# Patient Record
Sex: Female | Born: 1975 | Hispanic: No | Marital: Married | Smoking: Never smoker
Health system: Southern US, Community
[De-identification: ages and names within clinical notes are randomized; demographics above are authoritative.]

## PROBLEM LIST (undated history)

## (undated) DIAGNOSIS — N649 Disorder of breast, unspecified: Secondary | ICD-10-CM

## (undated) DIAGNOSIS — R87629 Unspecified abnormal cytological findings in specimens from vagina: Secondary | ICD-10-CM

## (undated) HISTORY — DX: Unspecified abnormal cytological findings in specimens from vagina: R87.629

## (undated) HISTORY — PX: COLPOSCOPY: SHX161

## (undated) HISTORY — DX: Disorder of breast, unspecified: N64.9

---

## 1998-05-15 ENCOUNTER — Other Ambulatory Visit: Admission: RE | Admit: 1998-05-15 | Discharge: 1998-05-15 | Payer: Self-pay | Admitting: Obstetrics and Gynecology

## 1999-11-08 ENCOUNTER — Emergency Department (HOSPITAL_COMMUNITY): Admission: EM | Admit: 1999-11-08 | Discharge: 1999-11-08 | Payer: Self-pay | Admitting: *Deleted

## 2000-03-09 ENCOUNTER — Inpatient Hospital Stay (HOSPITAL_COMMUNITY): Admission: AD | Admit: 2000-03-09 | Discharge: 2000-03-09 | Payer: Self-pay | Admitting: *Deleted

## 2000-03-14 ENCOUNTER — Inpatient Hospital Stay (HOSPITAL_COMMUNITY): Admission: AD | Admit: 2000-03-14 | Discharge: 2000-03-14 | Payer: Self-pay | Admitting: *Deleted

## 2000-03-21 ENCOUNTER — Inpatient Hospital Stay (HOSPITAL_COMMUNITY): Admission: AD | Admit: 2000-03-21 | Discharge: 2000-03-21 | Payer: Self-pay | Admitting: *Deleted

## 2000-03-28 ENCOUNTER — Inpatient Hospital Stay (HOSPITAL_COMMUNITY): Admission: AD | Admit: 2000-03-28 | Discharge: 2000-03-28 | Payer: Self-pay | Admitting: Obstetrics

## 2000-04-13 ENCOUNTER — Encounter: Payer: Self-pay | Admitting: *Deleted

## 2000-04-13 ENCOUNTER — Inpatient Hospital Stay (HOSPITAL_COMMUNITY): Admission: RE | Admit: 2000-04-13 | Discharge: 2000-04-13 | Payer: Self-pay | Admitting: *Deleted

## 2000-04-15 ENCOUNTER — Ambulatory Visit (HOSPITAL_COMMUNITY): Admission: RE | Admit: 2000-04-15 | Discharge: 2000-04-15 | Payer: Self-pay | Admitting: Obstetrics & Gynecology

## 2000-05-18 ENCOUNTER — Encounter: Admission: RE | Admit: 2000-05-18 | Discharge: 2000-05-18 | Payer: Self-pay | Admitting: Obstetrics & Gynecology

## 2000-08-22 ENCOUNTER — Inpatient Hospital Stay (HOSPITAL_COMMUNITY): Admission: AD | Admit: 2000-08-22 | Discharge: 2000-08-22 | Payer: Self-pay | Admitting: *Deleted

## 2000-08-24 ENCOUNTER — Inpatient Hospital Stay (HOSPITAL_COMMUNITY): Admission: AD | Admit: 2000-08-24 | Discharge: 2000-08-24 | Payer: Self-pay | Admitting: *Deleted

## 2000-08-31 ENCOUNTER — Inpatient Hospital Stay (HOSPITAL_COMMUNITY): Admission: AD | Admit: 2000-08-31 | Discharge: 2000-08-31 | Payer: Self-pay | Admitting: *Deleted

## 2000-09-08 ENCOUNTER — Inpatient Hospital Stay (HOSPITAL_COMMUNITY): Admission: AD | Admit: 2000-09-08 | Discharge: 2000-09-08 | Payer: Self-pay | Admitting: Obstetrics

## 2000-09-22 ENCOUNTER — Inpatient Hospital Stay (HOSPITAL_COMMUNITY): Admission: AD | Admit: 2000-09-22 | Discharge: 2000-09-22 | Payer: Self-pay | Admitting: Obstetrics

## 2000-10-03 ENCOUNTER — Inpatient Hospital Stay (HOSPITAL_COMMUNITY): Admission: AD | Admit: 2000-10-03 | Discharge: 2000-10-03 | Payer: Self-pay | Admitting: Obstetrics

## 2001-08-10 ENCOUNTER — Encounter: Admission: RE | Admit: 2001-08-10 | Discharge: 2001-11-08 | Payer: Self-pay | Admitting: Gynecology

## 2002-02-20 ENCOUNTER — Inpatient Hospital Stay (HOSPITAL_COMMUNITY): Admission: AD | Admit: 2002-02-20 | Discharge: 2002-02-23 | Payer: Self-pay | Admitting: *Deleted

## 2003-05-09 ENCOUNTER — Encounter (INDEPENDENT_AMBULATORY_CARE_PROVIDER_SITE_OTHER): Payer: Self-pay | Admitting: Specialist

## 2003-05-09 ENCOUNTER — Ambulatory Visit (HOSPITAL_BASED_OUTPATIENT_CLINIC_OR_DEPARTMENT_OTHER): Admission: RE | Admit: 2003-05-09 | Discharge: 2003-05-09 | Payer: Self-pay | Admitting: Gynecology

## 2003-07-04 ENCOUNTER — Other Ambulatory Visit: Admission: RE | Admit: 2003-07-04 | Discharge: 2003-07-04 | Payer: Self-pay | Admitting: Gynecology

## 2004-07-04 ENCOUNTER — Other Ambulatory Visit: Admission: RE | Admit: 2004-07-04 | Discharge: 2004-07-04 | Payer: Self-pay | Admitting: Gynecology

## 2005-07-16 ENCOUNTER — Other Ambulatory Visit: Admission: RE | Admit: 2005-07-16 | Discharge: 2005-07-16 | Payer: Self-pay | Admitting: Gynecology

## 2006-08-20 ENCOUNTER — Other Ambulatory Visit: Admission: RE | Admit: 2006-08-20 | Discharge: 2006-08-20 | Payer: Self-pay | Admitting: Gynecology

## 2007-08-26 ENCOUNTER — Other Ambulatory Visit: Admission: RE | Admit: 2007-08-26 | Discharge: 2007-08-26 | Payer: Self-pay | Admitting: Gynecology

## 2011-01-23 NOTE — H&P (Signed)
NAME:  Brianna Maldonado, Brianna Maldonado                        ACCOUNT NO.:  [id number]   MEDICAL RECORD NO.:  [id number]                   PATIENT TYPE:  AMB   LOCATION:  [facility]                                 FACILITY:  [facility]   PHYSICIAN:  Juan H. Lily Peer, M.D.             DATE OF BIRTH:  03-06-1976   DATE OF ADMISSION:  05/09/2003  DATE OF DISCHARGE:                                HISTORY & PHYSICAL   CHIEF COMPLAINT:  Left labial mass.   HISTORY:  The patient is a 35 year old gravida 2, para 1, AB 1 who presented  to the office on May 07, 2003 complaining of swelling in the left labial  region.  The patient has stated that she has had a Bartholin gland abscess  drained in 2001 and 2002 and she had been placed on antibiotics and sitz  bath and had had Word catheter and now this has recurred again.  On  inspection in the office she has got like a 5 cm left blocked Bartholin duct  which was tender on palpation; no other abnormality was noted.  The patient  is being scheduled to undergo I&D and marsupialization of Bartholin gland.   PAST MEDICAL HISTORY:  The patient has had one normal spontaneous vaginal  delivery.  She has had one miscarriage.  She has been using barrier  contraception and at one time had been on Depo-Provera.  She denies any  allergies.   PHYSICAL EXAMINATION:  GENERAL:  Well-developed, well-nourished female with  the above-mentioned complaint.  HEENT:  Unremarkable.  NECK:  Supple, trachea midline; no carotid bruits, no thyromegaly.  LUNGS:  Clear to auscultation without rhonchi or wheezes.  HEART:  Regular rate and rhythm without murmurs or gallops.  BREAST:  Exam not done.  ABDOMEN:  Soft, nontender without rebound or guarding.  PELVIC:  Bartholin, urethral, Skene glands as described above.  Vagina and  cervix no lesion or discharge.  Uterus anteverted, normal size, shape and  consistency.  Adnexa no mass or tenderness.  RECTAL:  Exam deferred.   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  Risks, benefits  and pros and cons of the  operation were discussed with the patient to include infection, bleeding,  trauma to internal organs or nearby structures.  She will be placed on  prophylaxis antibiotics.  She is currently on dicloxacillin 500 mg q.i.d.  which was prescribed to her on May 07, 2003 at the office which she will  continue to complete a 10-day course, all of the above was explained in  [place], all questions were answered and we will follow accordingly.   PLAN:  The patient scheduled for Bartholin gland marsupialization Wednesday,  May 09, 2003 at 8:30 a.m. at the [facility]  [facility].  Please  have history and physical available.     JHF/MEDQ  D:  05/08/2003  T:  05/08/2003  Job:  [id number]

## 2011-01-23 NOTE — Consult Note (Signed)
[place]  Patient:    Brianna Maldonado, Brianna Maldonado                     MRN: [id number] Proc. Date: 11/08/99 Adm. Date:  [id number] Attending:  Carmelina Peal CC:         Earl Lites, M.D., [facility]                          Consultation Report  CHIEF COMPLAINT:  Right peritonsillar abscess.  HISTORY OF PRESENT ILLNESS:  A 35 year old [place] immigrant with a four-day history of progressive sore throat localizing to the right side with ear pain, trismus and difficulty swallowing.  She speaks very little [place] but is accompanied by her sister who helps [place] with the history.  Earlier today, she saw Dr. Cleta Alberts at [facility] who sent her our way with the diagnosis of a peritonsillar abscess.  She received a jar of amoxicillin capsules but has not yet had any therapy.  She denies fever.  No history of diabetes or immune compromise.  No past history of Strep throat, tonsillitis, or abscess.  PHYSICAL EXAMINATION:  GENERAL:  This is an adult, Hispanic female looking anxious and in discomfort. She is not warm to touch.  She has a significant fetor oris and also some trismus. She has a hot-potato voice.  HEENT:  Head is atraumatic.  Ear canals clear with aerated drums of normal configuration.  Cranial nerves intact.  Anterior nose clear.  Oral cavity is moist with teeth in good repair.  She has erythematous bulging of the right hem-soft palate, exudate and debris from the tonsil itself, deviation of the uvula across the midline towards the left, and an essentially normal-appearing left tonsil.  NECK:  Slightly tender over the jugular epigastric region without discrete adenopathy.  IMPRESSION:  Right peritonsillar abscess.  PLAN:  With informed consent, I anesthetized the pharynx with Hurricaine spray,  localized the peritonsillar tissues with 1% Xylocaine with 1:100,000 epinephrine, 8 cc total, and finally performed a 2 cm crescent incision over the  superior pole of the tonsil, carried down to the capsule of the tonsil.  Behind the superior pole, and large abscess cavity was encountered sharply and bluntly.  The cavity was fully evacuated.  Hemostasis was spontaneous.  The patient tolerated the procedure nicely.  Will have her gargle with saline and peroxide to clear the oral cavity. We will send her home with liquid amoxicillin, 1 g x 2 doses, 500 mg for 3 more  days, to be followed by 500 mg 2 times daily of capsules for an additional 7 days. Will send her home with Tylenol with codeine elixir, enough to last her 3 or 4 days.  We will see her back in the office in two days to reevaluate.  In the absence of a past history of tonsillitis or Strep throat, would not recommend a  tonsillectomy.  She understands and agrees.  She will maintain low key activity for a couple of days, advance diet as comfortable, make sure she is well hydrated. She knows to contact us for bleeding or infection. DD:  11/08/99 TD:  11/09/99 Job: [id number]

## 2011-01-23 NOTE — Op Note (Signed)
   NAME:  Brianna Maldonado, Brianna Maldonado                        ACCOUNT NO.:  [id number]   MEDICAL RECORD NO.:  [id number]                   PATIENT TYPE:  AMB   LOCATION:  [facility]                                 FACILITY:  [facility]   PHYSICIAN:  Juan H. Lily Peer, M.D.             DATE OF BIRTH:  22-May-1976   DATE OF PROCEDURE:  05/09/2003  DATE OF DISCHARGE:                                 OPERATIVE REPORT   SURGEON:  Juan H. Lily Peer, M.D.   INDICATION FOR OPERATION:  A 35 year old with recurrent left Bartholin  cyst/abscess.   PREOPERATIVE DIAGNOSIS:  Recurrent Bartholin duct cyst/abscess.   POSTOPERATIVE DIAGNOSIS1:  Left Bartholin duct abscess.   ANESTHESIA:  MAC.   PROCEDURE PERFORMED:  Left Bartholin gland marsupialization and also removal  of perineal mole.   DESCRIPTION OF OPERATION:  After the patient was taken to the operating room  and received intravenous sedation via mask, she was placed in low lithotomy  position.  The vagina and perineum were prepped and draped in the usual  sterile fashion.  Red rubber Roxan Hockey was utilized to evacuate the bladder  contents of approximately 75 mL.  The patient had been on dicloxacillin 500  mg q.i.d. for the past 3-4 days and will continue to do so to complete a 10-  day course once released from the hospital.  The left Bartholin duct  gland/abscess cyst was visualized and measured approximately 3 cm in size.  A small vertical incision was made in the vaginal portion directly overlying  the defect, and purulent-like discharge, putrid, extruded from the Bartholin  gland.  Cultures for aerobic and anaerobic were obtained, and the cavity was  irrigated copiously with normal saline solution.  The gland was sutured to  the vaginal mucosa with interrupted sutures of 3-0 Vicryl suture.  Xylocaine  2% was used for postoperative analgesia.  A small, hyperpigmented, raised  mole in the patient's right perineal region was excised and passed off for  pathological evaluation, and the skin was reapproximated with two  interrupted sutures of 3-0 Vicryl suture, and Xylocaine gel was applied for  local relief as well.  The patient was transferred to the recovery room with  stable vital signs.  Blood loss was minimal.  IV fluids was 750 mL of  lactated Ringer's.                                               Juan H. Lily Peer, M.D.    JHF/MEDQ  D:  05/09/2003  T:  05/09/2003  Job:  [id number]

## 2015-07-04 ENCOUNTER — Telehealth: Payer: Self-pay | Admitting: *Deleted

## 2015-07-04 ENCOUNTER — Ambulatory Visit (INDEPENDENT_AMBULATORY_CARE_PROVIDER_SITE_OTHER): Payer: Self-pay | Admitting: Gynecology

## 2015-07-04 ENCOUNTER — Other Ambulatory Visit: Payer: Self-pay | Admitting: Pediatrics

## 2015-07-04 ENCOUNTER — Encounter: Payer: Self-pay | Admitting: Gynecology

## 2015-07-04 VITALS — BP 124/80 | Ht 60.5 in | Wt 145.0 lb

## 2015-07-04 DIAGNOSIS — N63 Unspecified lump in unspecified breast: Secondary | ICD-10-CM

## 2015-07-04 DIAGNOSIS — N6310 Unspecified lump in the right breast, unspecified quadrant: Secondary | ICD-10-CM

## 2015-07-04 DIAGNOSIS — N644 Mastodynia: Secondary | ICD-10-CM

## 2015-07-04 MED ORDER — CEPHALEXIN 500 MG PO CAPS: 500.0000 mg | ORAL_CAPSULE | Freq: Four times a day (QID) | ORAL | Status: DC

## 2015-07-04 NOTE — Telephone Encounter (Signed)
[facility]05[facility]15 [facility], orders placed.

## 2015-07-04 NOTE — Progress Notes (Signed)
   Patient is a 39 year old gravida 2 para 1 AB 10 (normal spontaneous vaginal delivery) who has not been seen the office for over 5 years. She states she had been receiving her examination in [place] in the last one was almost a year ago. The reason for patient's visit today is that since October 6 she started noticing a tender indurated area of her right breast. She denies any recent trauma or injury. She denies any nipple discharge. Patient with no prior mammogram or any family history of breast cancer.  Both breasts were examined sitting supine position the right breast lift much larger than the left breast. Slightly inverted right nipple was noted some peau de orange discoloration was noted in the perioral region. The mass extended from 9:00 to the 3:00 position. We'll very tender on palpation. There was no supraclavicular axillary lymphadenopathy on either breast.  Picture description below: Physical Exam  Pulmonary/Chest:     assessment/plan: Possible right breast abscess versus malignancy. Patient be referred for diagnostic mammogram and ultrasound. I want to start her on broad-spectrum antibiotics Keflex 500 mg her to take 1 by mouth 4 times a day for 10 days. We will also make arrangements for her to follow up with a general surgeon shortly after preferably the same day after her diagnostic mammogram. All the above instructions were provided in Spanish.

## 2015-07-04 NOTE — Telephone Encounter (Signed)
Araina called back from the breast center and said that since pt is taking Keflex 500 mg her to take 1 by mouth 4 times a day for 10 days she will need to finish antibiotic before any imaging. Pt is also private pay and will need to contact [phone] to help with financial cost, was informed to call first thing in am to speak with breast center financial office at the above # once speaking with them they will contact the breast center of [place] to schedule imaging for patient.

## 2015-07-05 ENCOUNTER — Telehealth: Payer: Self-pay | Admitting: *Deleted

## 2015-07-05 ENCOUNTER — Inpatient Hospital Stay: Admission: RE | Admit: 2015-07-05 | Payer: Self-pay | Source: Ambulatory Visit

## 2015-07-05 ENCOUNTER — Other Ambulatory Visit: Payer: Self-pay

## 2015-07-05 NOTE — Telephone Encounter (Signed)
Appointment was also needed at [facility] scheduled with Dr. Rayburn MaBlackmon on 07/19/15 @ 8:50am since patient is private pay she will need to bring $238 time of office visit. Claudia informed patient

## 2015-07-08 ENCOUNTER — Ambulatory Visit
Admission: RE | Admit: 2015-07-08 | Discharge: 2015-07-08 | Disposition: A | Discharge status: Home / Self Care | Payer: Self-pay | Source: Ambulatory Visit | Attending: Gynecology | Admitting: Gynecology

## 2015-07-08 ENCOUNTER — Other Ambulatory Visit: Payer: Self-pay | Admitting: Gynecology

## 2015-07-08 DIAGNOSIS — N63 Unspecified lump in unspecified breast: Secondary | ICD-10-CM

## 2015-07-08 DIAGNOSIS — N644 Mastodynia: Secondary | ICD-10-CM

## 2015-07-11 LAB — CULTURE, ROUTINE-ABSCESS

## 2015-07-12 ENCOUNTER — Encounter (HOSPITAL_COMMUNITY): Payer: Self-pay

## 2015-07-12 ENCOUNTER — Ambulatory Visit (HOSPITAL_COMMUNITY)
Admission: RE | Admit: 2015-07-12 | Discharge: 2015-07-12 | Disposition: A | Discharge status: Home / Self Care | Payer: Self-pay | Source: Ambulatory Visit | Attending: Obstetrics and Gynecology | Admitting: Obstetrics and Gynecology

## 2015-07-12 ENCOUNTER — Other Ambulatory Visit (HOSPITAL_COMMUNITY): Payer: Self-pay | Admitting: *Deleted

## 2015-07-12 VITALS — BP 102/64 | Temp 98.4°F | Ht 63.0 in | Wt 145.0 lb

## 2015-07-12 DIAGNOSIS — Z01419 Encounter for gynecological examination (general) (routine) without abnormal findings: Secondary | ICD-10-CM

## 2015-07-12 DIAGNOSIS — N898 Other specified noninflammatory disorders of vagina: Secondary | ICD-10-CM

## 2015-07-12 DIAGNOSIS — N6310 Unspecified lump in the right breast, unspecified quadrant: Secondary | ICD-10-CM

## 2015-07-12 NOTE — Addendum Note (Signed)
Encounter addended by: Priscille Heidelberghristine P Brannock, RN on: 07/12/2015  4:42 PM<BR>     Documentation filed: Notes Section, ED Follow-Up, Patient Instructions Section, Charges VN, Visit Diagnoses

## 2015-07-12 NOTE — Progress Notes (Signed)
Complaints of right breast lump x 1 month that is swollen and painful. Patient states the pain is constant rating it at a 6 out of 10. Patient had a right breast ultrasound and aspiration on 07/08/2015 stating it was probably benign with a follow-up right breast ultrasound recommended in 2-3 weeks.   Patient complained of some vaginal discharge and itchiness. Patient is currently on Keflex.  Pap Smear: Completed Pap smear today. Last Pap smear was over 3 years ago in [place] and normal per patient. Per patient has no history of an abnormal Pap smear. No Pap smear results in EPIC.  Physical exam: Breasts Right breast swollen and larger than left breast. No skin abnormalities left breast. A Band-Aid was in place on the right breast around 9 o'clock where breast aspiration was done. No nipple retraction left breast. Slight right nipple retraction observed. No nipple discharge bilateral breasts. No lymphadenopathy. No lumps palpated left breast. Palpated a firm mass within the right breast that extends from 3 o'clock to 9 o'clock. Patient complained of tenderness when palpated breast mass. [facility] per recommendation. Appointment scheduled for Monday, July 22, 2015 at 0810.    Pelvic/Bimanual   Ext Genitalia No lesions, no swelling and no discharge observed on external genitalia.         Vagina Vagina pink and normal texture. No lesions and thick white discharge observed in vagina. Wet prep completed.          Cervix Cervix is present. Cervix pink and of normal texture. Thick white discharge observed on cervix.        Uterus Uterus is present and palpable. Uterus in normal position and normal size.       Adnexae Bilateral ovaries present and palpable. No tenderness on palpation.        Rectovaginal No rectal exam completed today since patient had no rectal complaints. No skin abnormalities observed.   Used interpreter  Nira ConnJulia Sowell.

## 2015-07-12 NOTE — Patient Instructions (Signed)
Educational materials on self breast awareness given. Explained to Brianna Maldonado that [facility] will cover Pap smears and HPV typing every 5 years unless has a history of abnormal Pap smears. [facility] per recommendation. Appointment scheduled for Monday, July 22, 2015 at 0810. Patient aware of appointment and will be there. Let patient know will follow up with her within the next couple weeks with results of Pap smear and wet prep by phone. [name] verbalized understanding.  Brianna Maldonado, Brianna Maserhristine Poll, RN 4:30 PM

## 2015-07-17 LAB — CYTOLOGY - PAP

## 2015-07-19 ENCOUNTER — Other Ambulatory Visit (HOSPITAL_COMMUNITY): Payer: Self-pay | Admitting: *Deleted

## 2015-07-19 ENCOUNTER — Encounter (HOSPITAL_COMMUNITY): Payer: Self-pay | Admitting: *Deleted

## 2015-07-19 ENCOUNTER — Telehealth (HOSPITAL_COMMUNITY): Payer: Self-pay | Admitting: *Deleted

## 2015-07-19 DIAGNOSIS — B379 Candidiasis, unspecified: Secondary | ICD-10-CM

## 2015-07-19 MED ORDER — FLUCONAZOLE 150 MG PO TABS: 150.0000 mg | ORAL_TABLET | Freq: Once | ORAL | Status: DC

## 2015-07-19 NOTE — Telephone Encounter (Signed)
Telephoned patient at home # and discussed abnormal pap smear results and need for colpo. Also advised patient of yeast seen on pap smear and [facility] Advised patient [facility] would call with appointment time for colpo if not heard from them in 2 weeks to give me a call back. Patient voiced understanding. Used interpreter Delorise RoyalsJulie Sowell.

## 2015-07-22 ENCOUNTER — Ambulatory Visit
Admission: RE | Admit: 2015-07-22 | Discharge: 2015-07-22 | Disposition: A | Discharge status: Home / Self Care | Payer: No Typology Code available for payment source | Source: Ambulatory Visit | Attending: Gynecology | Admitting: Gynecology

## 2015-07-22 DIAGNOSIS — N644 Mastodynia: Secondary | ICD-10-CM

## 2015-07-22 DIAGNOSIS — N63 Unspecified lump in unspecified breast: Secondary | ICD-10-CM

## 2015-07-29 ENCOUNTER — Other Ambulatory Visit: Payer: Self-pay

## 2015-07-31 ENCOUNTER — Other Ambulatory Visit: Payer: Self-pay | Admitting: General Surgery

## 2015-07-31 DIAGNOSIS — N611 Abscess of the breast and nipple: Secondary | ICD-10-CM

## 2015-08-07 ENCOUNTER — Ambulatory Visit: Payer: Self-pay | Admitting: Gynecology

## 2015-08-12 ENCOUNTER — Ambulatory Visit
Admission: RE | Admit: 2015-08-12 | Discharge: 2015-08-12 | Disposition: A | Discharge status: Home / Self Care | Payer: No Typology Code available for payment source | Source: Ambulatory Visit | Attending: General Surgery | Admitting: General Surgery

## 2015-08-12 ENCOUNTER — Other Ambulatory Visit: Payer: Self-pay | Admitting: Gynecology

## 2015-08-12 ENCOUNTER — Ambulatory Visit
Admission: RE | Admit: 2015-08-12 | Discharge: 2015-08-12 | Disposition: A | Discharge status: Home / Self Care | Payer: No Typology Code available for payment source | Source: Ambulatory Visit | Attending: Gynecology | Admitting: Gynecology

## 2015-08-12 ENCOUNTER — Other Ambulatory Visit: Payer: Self-pay | Admitting: General Surgery

## 2015-08-12 DIAGNOSIS — N644 Mastodynia: Secondary | ICD-10-CM

## 2015-08-12 DIAGNOSIS — N611 Abscess of the breast and nipple: Secondary | ICD-10-CM

## 2015-08-12 DIAGNOSIS — N63 Unspecified lump in unspecified breast: Secondary | ICD-10-CM

## 2015-08-12 HISTORY — PX: BREAST BIOPSY: SHX20

## 2015-08-14 ENCOUNTER — Other Ambulatory Visit: Payer: Self-pay | Admitting: Gynecology

## 2015-08-14 ENCOUNTER — Ambulatory Visit
Admission: RE | Admit: 2015-08-14 | Discharge: 2015-08-14 | Disposition: A | Discharge status: Home / Self Care | Payer: No Typology Code available for payment source | Source: Ambulatory Visit | Attending: Gynecology | Admitting: Gynecology

## 2015-08-14 ENCOUNTER — Ambulatory Visit: Payer: Self-pay | Admitting: Gynecology

## 2015-08-14 DIAGNOSIS — N644 Mastodynia: Secondary | ICD-10-CM

## 2015-08-14 DIAGNOSIS — N63 Unspecified lump in unspecified breast: Secondary | ICD-10-CM

## 2015-08-14 DIAGNOSIS — N611 Abscess of the breast and nipple: Secondary | ICD-10-CM

## 2015-08-15 ENCOUNTER — Encounter: Payer: Self-pay | Admitting: Obstetrics & Gynecology

## 2015-08-15 ENCOUNTER — Other Ambulatory Visit (HOSPITAL_COMMUNITY)
Admission: RE | Admit: 2015-08-15 | Discharge: 2015-08-15 | Disposition: A | Discharge status: Home / Self Care | Payer: Self-pay | Source: Ambulatory Visit | Attending: Obstetrics & Gynecology | Admitting: Obstetrics & Gynecology

## 2015-08-15 ENCOUNTER — Ambulatory Visit (INDEPENDENT_AMBULATORY_CARE_PROVIDER_SITE_OTHER): Payer: Self-pay | Admitting: Obstetrics & Gynecology

## 2015-08-15 VITALS — BP 101/67 | HR 79 | Temp 97.8°F | Resp 19 | Ht 60.5 in | Wt 146.5 lb

## 2015-08-15 DIAGNOSIS — IMO0002 Reserved for concepts with insufficient information to code with codable children: Secondary | ICD-10-CM | POA: Insufficient documentation

## 2015-08-15 DIAGNOSIS — R896 Abnormal cytological findings in specimens from other organs, systems and tissues: Secondary | ICD-10-CM

## 2015-08-15 DIAGNOSIS — R87619 Unspecified abnormal cytological findings in specimens from cervix uteri: Secondary | ICD-10-CM | POA: Insufficient documentation

## 2015-08-15 LAB — POCT PREGNANCY, URINE: Preg Test, Ur: NEGATIVE

## 2015-08-15 NOTE — Patient Instructions (Signed)
Colposcopa, Cuidado posterior (Colposcopy, Care After) La colposcopa es un procedimiento en el que se utiliza una herramienta especial para magnificar la superficie del cuello del tero. Tambin es posible que se tome una muestra de tejido (biopsia). Esta muestra se observar para identificar la presencia de cncer cervical u otros problemas. Despus del procedimiento:  Podr sentir algunos clicos.  Recustese algunos minutos si se siente mareada.  Podr tener un sangrado que debera detenerse luego de algunos das. CUIDADOS EN EL HOGAR  No tenga relaciones sexuales ni use tampones durante 2 o 3 das o segn le hayan indicado.  Slo tome medicamentos como lo indique su mdico.  Contine tomando las pastillas anticonceptivas de la forma habitual. Averige los resultados de su anlisis Pregunte cundo estarn listos los resultados del examen. Asegrese de obtener los resultados. SOLICITE AYUDA DE INMEDIATO SI:  Tiene un sangrado abundante o elimina cogulos.  Su temperatura es de 102 F (38.9 C) o mayor.  Observa una secrecin vaginal anormal.  Tiene clicos que no se van con los medicamentos.  Siente mareos, vrtigo o pierde el conocimiento (se desmaya). ASEGRESE DE QUE:   Comprende estas instrucciones.  Controlar su enfermedad.  Solicitar ayuda de inmediato si no mejora o si empeora.   Esta informacin no tiene como fin reemplazar el consejo del mdico. Asegrese de hacerle al mdico cualquier pregunta que tenga.   Document Released: 09/26/2010 Document Revised: 11/16/2011 Elsevier Interactive Patient Education 2016 Elsevier Inc.  

## 2015-08-15 NOTE — Progress Notes (Signed)
Patient ID: Brianna Maldonado, female   DOB: [birth year], 39 y.o.   MRN: [id number]  Chief Complaint  Patient presents with  . Colposcopy   Referred for LSIL pap 07/2015 HPI Brianna Maldonado is a 39 y.o. female.  [id number] Patient's last menstrual period was 08/10/2015.   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 Previous colposcopy: no. Prior cervical treatment: no treatment.  Past Medical History  Diagnosis Date  . Vaginal Pap smear, abnormal   . Breast disorder     History reviewed. No pertinent past surgical history.  Family History  Problem Relation Age of Onset  . Stroke Father     Social History Social History  Substance Use Topics  . Smoking status: Never Smoker   . Smokeless tobacco: None  . Alcohol Use: 0.0 oz/week    0 Standard drinks or equivalent per week     Comment: occ    No Known Allergies  Current Outpatient Prescriptions  Medication Sig Dispense Refill  . cephALEXin (KEFLEX) 500 MG capsule Take 1 capsule (500 mg total) by mouth 4 (four) times daily. (Patient not taking: Reported on 08/15/2015) 40 capsule 0  . fluconazole (DIFLUCAN) 150 MG tablet Take 1 tablet (150 mg total) by mouth once. (Patient not taking: Reported on 08/15/2015) 1 tablet 0   No current facility-administered medications for this visit.    Review of Systems Review of Systems  Blood pressure 101/67, pulse 79, temperature 97.8 F (36.6 C), temperature source Oral, resp. rate 19, height 5' 0.5" (1.537 m), weight 146 lb 8 oz (66.452 kg), last menstrual period 08/10/2015, SpO2 100 %.  Physical Exam Physical Exam  Data Reviewed Pap result  Assessment    Procedure Details  The risks and benefits of the procedure and Written informed consent obtained.  Speculum placed in vagina and excellent visualization of cervix achieved, cervix swabbed x 3 with acetic acid solution. SCJ  seen no discrete lesion seen Specimens: ECC and bx 1200, treated with AgNO3  Complications: none.     Plan    Specimens labelled and sent to Pathology. Call  to discuss Pathology results in 2 weeks.      Keelin Neville 08/15/2015, 3:05 PM

## 2015-08-26 ENCOUNTER — Telehealth: Payer: Self-pay | Admitting: *Deleted

## 2015-08-26 NOTE — Telephone Encounter (Signed)
Brianna HesselbachMaria called front desk asking for results. Call transferred to nurse. Talked to patient with Delphina CahillSusan Ross, [place].  Informed her per Dr. Debroah LoopArnold - CIN1 , reccomendation is pap with cotesting in one year. Patient voices understanding.

## 2015-09-13 ENCOUNTER — Other Ambulatory Visit: Payer: No Typology Code available for payment source

## 2015-09-23 ENCOUNTER — Ambulatory Visit
Admission: RE | Admit: 2015-09-23 | Discharge: 2015-09-23 | Disposition: A | Discharge status: Home / Self Care | Payer: No Typology Code available for payment source | Source: Ambulatory Visit | Attending: Gynecology | Admitting: Gynecology

## 2015-09-23 ENCOUNTER — Other Ambulatory Visit: Payer: Self-pay

## 2015-09-23 DIAGNOSIS — N611 Abscess of the breast and nipple: Secondary | ICD-10-CM

## 2016-07-23 ENCOUNTER — Other Ambulatory Visit: Payer: Self-pay | Admitting: Obstetrics and Gynecology

## 2016-07-23 DIAGNOSIS — Z1231 Encounter for screening mammogram for malignant neoplasm of breast: Secondary | ICD-10-CM

## 2016-08-13 ENCOUNTER — Ambulatory Visit (HOSPITAL_COMMUNITY)
Admission: RE | Admit: 2016-08-13 | Discharge: 2016-08-13 | Disposition: A | Discharge status: Home / Self Care | Payer: Self-pay | Source: Ambulatory Visit | Attending: Obstetrics and Gynecology | Admitting: Obstetrics and Gynecology

## 2016-08-13 ENCOUNTER — Encounter (HOSPITAL_COMMUNITY): Payer: Self-pay

## 2016-08-13 ENCOUNTER — Ambulatory Visit
Admission: RE | Admit: 2016-08-13 | Discharge: 2016-08-13 | Disposition: A | Discharge status: Home / Self Care | Payer: No Typology Code available for payment source | Source: Ambulatory Visit | Attending: Obstetrics and Gynecology | Admitting: Obstetrics and Gynecology

## 2016-08-13 ENCOUNTER — Other Ambulatory Visit (HOSPITAL_COMMUNITY): Payer: Self-pay | Admitting: *Deleted

## 2016-08-13 VITALS — BP 104/70 | Temp 98.3°F | Ht 64.17 in | Wt 149.0 lb

## 2016-08-13 DIAGNOSIS — N6320 Unspecified lump in the left breast, unspecified quadrant: Secondary | ICD-10-CM

## 2016-08-13 DIAGNOSIS — N6325 Unspecified lump in the left breast, overlapping quadrants: Secondary | ICD-10-CM

## 2016-08-13 DIAGNOSIS — Z01419 Encounter for gynecological examination (general) (routine) without abnormal findings: Secondary | ICD-10-CM

## 2016-08-13 DIAGNOSIS — Z1231 Encounter for screening mammogram for malignant neoplasm of breast: Secondary | ICD-10-CM

## 2016-08-13 NOTE — Addendum Note (Signed)
Encounter addended by: Priscille Heidelberghristine P Levone Otten, RN on: 08/13/2016  2:54 PM<BR>    Actions taken: Visit diagnoses modified

## 2016-08-13 NOTE — Patient Instructions (Signed)
Explained breast self awareness  Brianna Maldonado. Let her know if today's Pap smear is normal that her next Pap smear will be due in one year. [facility] a diagnostic mamm[facility] and possible left breast ultrasound. Appointment scheduled for Tuesday, September 01, 2016 at 0820. Patient aware of appointment and will be there. Let patient know will follow up with her within the next couple weeks with results\ of her Pap smear by phone. Idelle CrouchMaria M Eichhorst verbalized understanding.  Brannock, Kathaleen Maserhristine Poll, RN 12:04 PM

## 2016-08-13 NOTE — Progress Notes (Signed)
No complaints today.   Pap Smear: Pap smear completed today. [facility] and LGSIL with positive HPV. 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 Per patient her last Pap smear is the only abnormal Pap smear she has had. Last Pap smear and colposcopy result is in EPIC.  Physical exam: Breasts Breasts symmetrical. Reddened area observed bilateral upper breast that was symmetrical. No nipple retraction bilateral breasts. No nipple discharge bilateral breasts. No lymphadenopathy. No lumps palpated right breast. Palpated a lump within the left breast at 6 o'clock under areola. Complaints of tenderness when palpated lump within the left breast. [facility] for a diagnostic mammogram and possible left breast ultrasound. Appointment scheduled for Tuesday, September 01, 2016 at 0820.  Pelvic/Bimanual   Ext Genitalia No lesions, no swelling and no discharge observed on external genitalia.         Vagina Vagina pink and normal texture. No lesions or discharge observed in vagina.          Cervix Cervix is present. Cervix pink and of normal texture. No discharge observed.     Uterus Uterus is present and palpable. Uterus in normal position and normal size.        Adnexae Bilateral ovaries present and palpable. No tenderness on palpation.          Rectovaginal No rectal exam completed today since patient had no rectal complaints. No skin abnormalities observed on exam.    Smoking History: Patient has never smoked.  Patient Navigation: Patient education provided. Access to services provided for [facility] through [facility] program. Spanish interpreter provided.  Used Spanish interpreter [employer] Herrera from [facility].

## 2016-08-14 ENCOUNTER — Encounter (HOSPITAL_COMMUNITY): Payer: Self-pay | Admitting: *Deleted

## 2016-08-17 LAB — CYTOLOGY - PAP
Chlamydia: NEGATIVE
DIAGNOSIS: NEGATIVE
HPV (WINDOPATH): DETECTED — AB
NEISSERIA GONORRHEA: NEGATIVE

## 2016-08-18 ENCOUNTER — Telehealth (HOSPITAL_COMMUNITY): Payer: Self-pay | Admitting: *Deleted

## 2016-08-18 NOTE — Telephone Encounter (Signed)
Telephoned patient at home number and discussed negative pap smear results. HPV was negative. Next pap smear due in one year due to history of abnormal. Patient voiced understanding. Used interpreter Julie Sowell.  

## 2016-09-01 ENCOUNTER — Ambulatory Visit
Admission: RE | Admit: 2016-09-01 | Discharge: 2016-09-01 | Disposition: A | Discharge status: Home / Self Care | Payer: No Typology Code available for payment source | Source: Ambulatory Visit | Attending: Obstetrics and Gynecology | Admitting: Obstetrics and Gynecology

## 2016-09-01 ENCOUNTER — Other Ambulatory Visit: Payer: No Typology Code available for payment source

## 2016-09-01 DIAGNOSIS — N6320 Unspecified lump in the left breast, unspecified quadrant: Secondary | ICD-10-CM

## 2017-01-20 ENCOUNTER — Encounter: Payer: Self-pay | Admitting: Gynecology

## 2017-08-20 ENCOUNTER — Other Ambulatory Visit: Payer: Self-pay | Admitting: Obstetrics and Gynecology

## 2017-08-20 DIAGNOSIS — Z1231 Encounter for screening mammogram for malignant neoplasm of breast: Secondary | ICD-10-CM

## 2017-09-16 ENCOUNTER — Encounter (HOSPITAL_COMMUNITY): Payer: Self-pay

## 2017-09-16 ENCOUNTER — Ambulatory Visit (HOSPITAL_COMMUNITY)
Admission: RE | Admit: 2017-09-16 | Discharge: 2017-09-16 | Disposition: A | Discharge status: Home / Self Care | Payer: Self-pay | Source: Ambulatory Visit | Attending: Obstetrics and Gynecology | Admitting: Obstetrics and Gynecology

## 2017-09-16 ENCOUNTER — Ambulatory Visit
Admission: RE | Admit: 2017-09-16 | Discharge: 2017-09-16 | Disposition: A | Discharge status: Home / Self Care | Payer: No Typology Code available for payment source | Source: Ambulatory Visit | Attending: Obstetrics and Gynecology | Admitting: Obstetrics and Gynecology

## 2017-09-16 VITALS — BP 113/73

## 2017-09-16 DIAGNOSIS — Z01419 Encounter for gynecological examination (general) (routine) without abnormal findings: Secondary | ICD-10-CM

## 2017-09-16 DIAGNOSIS — Z1231 Encounter for screening mammogram for malignant neoplasm of breast: Secondary | ICD-10-CM

## 2017-09-16 DIAGNOSIS — N898 Other specified noninflammatory disorders of vagina: Secondary | ICD-10-CM

## 2017-09-16 NOTE — Addendum Note (Signed)
Encounter addended by: Priscille HeidelbergBrannock, Christine P, RN on: 09/16/2017 2:31 PM  Actions taken: Sign clinical note

## 2017-09-16 NOTE — Patient Instructions (Addendum)
Explained breast self awareness with [name]. Let her know that if today's Pap smear is normal that her next Pap smear will be due in one year due to her history of an abnormal Pap smear. [facility] screening mamm[facility]. Appointment scheduled for Thursday, September 16, 2017 at 1040. Let patient know the Breast Center will follow up with her within the next couple weeks with results of mammogram by letter or phone. Informed patient that will follow-up with her within the next couple of weeks with results of Pap smear and wet prep by phone. [name] verbalized understanding.  Amaro Mangold, Kathaleen Maserhristine Poll, RN 2:18 PM

## 2017-09-16 NOTE — Progress Notes (Signed)
Pap smear completed

## 2017-09-16 NOTE — Progress Notes (Signed)
No complaints today.   Pap Smear: Pap smear completed today. [facility] and normal with positive HPV. Patient has a history of one abnormal Pap smear 07/12/2015 that was LGSIL with positive HPV. 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 Per patient the Pap smear 07/12/2015 is the only abnormal Pap smear she has had. Last two Pap smears and colposcopy results are in EPIC.  Physical exam: Breasts Breasts symmetrical. No skin abnormalities bilateral breasts. No nipple retraction bilateral breasts. No nipple discharge bilateral breasts. No lymphadenopathy. No lumps palpated bilateral breasts. No complaints of pain or tenderness on exam. [facility]. Appointment scheduled for Thursday, September 16, 2017 at 1040.  Pelvic/Bimanual   Ext Genitalia No lesions, no swelling and no discharge observed on external genitalia.         Vagina Vagina pink and normal texture. No lesions and thick yellow discharge observed in vagina. Wet prep completed         Cervix Cervix is present. Cervix pink and of normal texture. Thick yellow discharge observed on cervix.    Uterus Uterus is present and palpable. Uterus in normal position and normal size.        Adnexae Bilateral ovaries present and palpable. No tenderness on palpation.          Rectovaginal No rectal exam completed today since patient had no rectal complaints. No skin abnormalities observed on exam.    Smoking History: Patient has never smoked.  Patient Navigation: Patient education provided. [facility]. Spanish interpreter provided.  Used Spanish interpreter [employer]rika McReynolds from [employer].

## 2017-09-17 LAB — CYTOLOGY - PAP
Adequacy: ABSENT
Bacterial vaginitis: NEGATIVE
Candida vaginitis: POSITIVE — AB
DIAGNOSIS: NEGATIVE
HPV: NOT DETECTED
Trichomonas: NEGATIVE

## 2017-09-21 ENCOUNTER — Other Ambulatory Visit (HOSPITAL_COMMUNITY): Payer: Self-pay | Admitting: *Deleted

## 2017-09-21 ENCOUNTER — Telehealth (HOSPITAL_COMMUNITY): Payer: Self-pay | Admitting: *Deleted

## 2017-09-21 MED ORDER — FLUCONAZOLE 150 MG PO TABS: 150.0000 mg | ORAL_TABLET | Freq: Once | ORAL | 0 refills | Status: AC

## 2017-09-21 NOTE — Telephone Encounter (Signed)
Telephoned patient at home number and advised patient of negative pap smear results. HPV was negative. Next pap smear due in one year. Advised patient pap smear did show yeast. Diflucan was called into pharmacy. Patient voiced understanding. Used interpreter Joslyn HyErica McReynolds.

## 2017-09-24 ENCOUNTER — Encounter (HOSPITAL_COMMUNITY): Payer: Self-pay | Admitting: *Deleted

## 2018-09-21 ENCOUNTER — Other Ambulatory Visit (HOSPITAL_COMMUNITY): Payer: Self-pay | Admitting: *Deleted

## 2018-09-21 DIAGNOSIS — Z1231 Encounter for screening mammogram for malignant neoplasm of breast: Secondary | ICD-10-CM

## 2018-11-30 ENCOUNTER — Telehealth (HOSPITAL_COMMUNITY): Payer: Self-pay

## 2018-11-30 NOTE — Telephone Encounter (Signed)
Left message with patient via [facility] interpreters letting her know that we will need to reschedule her BCCCP appointment due to the corona virus. Left name and number of interpreter Apolonio Schneiders for her to call back.

## 2018-12-06 ENCOUNTER — Other Ambulatory Visit (HOSPITAL_COMMUNITY): Payer: Self-pay | Admitting: *Deleted

## 2018-12-06 ENCOUNTER — Ambulatory Visit (HOSPITAL_COMMUNITY): Payer: No Typology Code available for payment source

## 2018-12-06 DIAGNOSIS — Z1231 Encounter for screening mammogram for malignant neoplasm of breast: Secondary | ICD-10-CM

## 2019-04-13 ENCOUNTER — Ambulatory Visit (HOSPITAL_COMMUNITY)
Admission: RE | Admit: 2019-04-13 | Discharge: 2019-04-13 | Disposition: A | Discharge status: Home / Self Care | Payer: No Typology Code available for payment source | Source: Ambulatory Visit | Attending: Obstetrics and Gynecology | Admitting: Obstetrics and Gynecology

## 2019-04-13 ENCOUNTER — Encounter (HOSPITAL_COMMUNITY): Payer: Self-pay | Admitting: *Deleted

## 2019-04-13 ENCOUNTER — Encounter (HOSPITAL_COMMUNITY): Payer: Self-pay

## 2019-04-13 ENCOUNTER — Other Ambulatory Visit: Payer: Self-pay

## 2019-04-13 ENCOUNTER — Ambulatory Visit
Admission: RE | Admit: 2019-04-13 | Discharge: 2019-04-13 | Disposition: A | Discharge status: Home / Self Care | Payer: No Typology Code available for payment source | Source: Ambulatory Visit | Attending: Obstetrics and Gynecology | Admitting: Obstetrics and Gynecology

## 2019-04-13 DIAGNOSIS — Z1231 Encounter for screening mammogram for malignant neoplasm of breast: Secondary | ICD-10-CM

## 2019-04-13 DIAGNOSIS — Z01419 Encounter for gynecological examination (general) (routine) without abnormal findings: Secondary | ICD-10-CM

## 2019-04-13 DIAGNOSIS — N898 Other specified noninflammatory disorders of vagina: Secondary | ICD-10-CM | POA: Insufficient documentation

## 2019-04-13 NOTE — Patient Instructions (Signed)
Explained breast self awareness with Marena Chancy. Let patient know that her next Pap smear recommendation will be based on the result of today's Pap smear. Referred patient to the [facility] for a screening mammogram. Appointment scheduled for Thursday, April 13, 2019 at 1040. Patient aware of appointment and will be there. Let patient know will follow up with her within the next week with results of Pap smear and wet prep by phone. Informed patient that the Breast Center will follow-up with her within the next couple of weeks with results of mammogram by letter or phone. Doyle Tegethoff Nazar verbalized understanding.  Arnetha Silverthorne, Arvil Chaco, RN 10:58 AM

## 2019-04-13 NOTE — Progress Notes (Signed)
No complaints today.   Pap Smear: Pap smear completed today. Last Pap smear was 09/16/2017 at [facility] and normal with negative HPV. Patients previous Pap smear 08/13/2016 was at [facility] clinic and normal with positive HPV. Patient has a history of one abnormal Pap smear 07/12/2015 that was LGSIL with positive HPV. 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 Per patient the Pap smear 07/12/2015 is the only abnormal Pap smear she has had. Last three Pap smears and colposcopy results are in EPIC.  Physical exam: Breasts Breasts symmetrical. No skin abnormalities bilateral breasts. No nipple retraction bilateral breasts. No nipple discharge bilateral breasts. No lymphadenopathy. No lumps palpated bilateral breasts. No complaints of pain or tenderness on exam. Referred patient to the [facility] for a screening mammogram. Appointment scheduled for Thursday, April 13, 2019 at 1040.        Pelvic/Bimanual   Ext Genitalia No lesions, no swelling and no discharge observed on external genitalia.         Vagina Vagina pink and normal texture. No lesions and thick white discharge observed in vagina. Wet prep completed.         Cervix Cervix is present. Cervix pink and of normal texture. Thick white discharge observed on cervix.    Uterus Uterus is present and palpable. Uterus in normal position and normal size.        Adnexae Bilateral ovaries present and palpable. No tenderness on palpation.         Rectovaginal No rectal exam completed today since patient had no rectal complaints. No skin abnormalities observed on exam.    Smoking History: Patient has never smoked.  Patient Navigation: Patient education provided. Access to services provided for patient through [facility] program. Spanish interpreter provided.   Breast and Cervical Cancer Risk Assessment: Patient has no family history of breast cancer, known genetic mutations,  or radiation treatment to the chest before age 80. Patient has a history of cervical dysplasia. Patient has not history of being immunocompromised or DES exposure in-utero.  Risk Assessment    Risk Scores      04/13/2019   Last edited by: Armond Hang, LPN   5-year risk: 1.1 %   Lifetime risk: 9.9 %         Used Spanish interpreter Rudene Anda from [place].

## 2019-04-17 LAB — CYTOLOGY - PAP
Diagnosis: NEGATIVE
HPV: NOT DETECTED

## 2019-04-18 LAB — CERVICOVAGINAL ANCILLARY ONLY
Bacterial vaginitis: NEGATIVE
Candida vaginitis: NEGATIVE
Chlamydia: NEGATIVE
Neisseria Gonorrhea: NEGATIVE
Trichomonas: NEGATIVE

## 2019-04-19 ENCOUNTER — Inpatient Hospital Stay: Payer: Self-pay | Attending: Obstetrics and Gynecology | Admitting: *Deleted

## 2019-04-19 ENCOUNTER — Other Ambulatory Visit: Payer: Self-pay

## 2019-04-19 VITALS — BP 108/70 | Temp 96.9°F | Ht 61.0 in | Wt 144.0 lb

## 2019-04-19 DIAGNOSIS — Z0000 Encounter for general adult medical examination without abnormal findings: Secondary | ICD-10-CM

## 2019-04-19 NOTE — Progress Notes (Signed)
Wisewoman initial screening   interpreter- Rudene Anda, [employer]   Clinical Measurement:  Height: 61 in Weight: 144 lb  Blood Pressure: 110/68  Blood Pressure #2: 108/70 Fasting Labs Drawn Today, will review with patient when they result.   Medical History:  Patient states that she does not have a history of high cholesterol, high blood pressure or diabetes.  Medications:  Patient states that she does not take  medication to lower cholesterol, blood pressure or blood sugar.  Patient does not take an aspirin a day to help prevent a heart attack or stroke.    Blood pressure, self measurement:  :  Patient states that she does not measure blood pressure from home.   Nutrition: Patient states that on average she eats 2 cups of fruit and 0 cups of vegetables per day. Patient states that she does eat fish at least 2 times per week. Patient eats less than half servings of whole grains. Patient drinks less than 36 ounces of beverages with added sugar weekly. Patient is currently watching sodium or salt intake. In the past 7 days patient has not had any drinks containing alcohol. On an average day when patient consumes alcohol patient drinks 2 alcoholic drinks.      Physical activity:  Patient states that she gets 0 minutes of moderate and 0 minutes of vigorous physical activity each week.  Smoking status:  Patient states that she has never smoked tobacco.   Quality of life:  Over the past 2 weeks patient states that she has not had any days where she has little interest or pleasure in doing things and 0 days where she has felt down, depressed or hopeless.    Risk reduction and counseling:    Health Coaching- Encouraged patient to add vegetables to daily diet, with an ultimate goal 3 servings of vegetables per day. Also spoke with patient about eating heart healthy fish twice a week and whole grains. Went over daily recommendations for exercise as well, 20-30 minutes.   Navigation:  I will notify  patient of lab results.  Patient is aware of 2 more health coaching sessions and a follow up.  Time: 20 minutes

## 2019-04-20 ENCOUNTER — Encounter (HOSPITAL_COMMUNITY): Payer: Self-pay | Admitting: *Deleted

## 2019-04-20 LAB — LIPID PANEL W/O CHOL/HDL RATIO
Cholesterol, Total: 147 mg/dL (ref 100–199)
HDL: 50 mg/dL (ref >39)
LDL Calculated: 84 mg/dL (ref 0–99)
Triglycerides: 63 mg/dL (ref 0–149)
VLDL Cholesterol Cal: 13 mg/dL (ref 5–40)

## 2019-04-20 LAB — GLUCOSE, RANDOM: Glucose: 95 mg/dL (ref 65–99)

## 2019-04-20 LAB — HGB A1C W/O EAG: Hgb A1c MFr Bld: 5.3 % (ref 4.8–5.6)

## 2019-04-20 NOTE — Progress Notes (Signed)
Advised patient of negative pap smear results. HPV was negative. Next pap smear due in one year. Wet prep results were normal. Patient voiced understanding. Used interpreter Rudene Anda.

## 2019-04-24 ENCOUNTER — Telehealth: Payer: Self-pay

## 2019-04-24 NOTE — Telephone Encounter (Signed)
Left message for patient via interpreter Rudene Anda to discuss lab results from [facility]. Told patient that I would try and call her back again tomorrow.

## 2019-04-25 ENCOUNTER — Telehealth (HOSPITAL_COMMUNITY): Payer: Self-pay

## 2019-04-25 NOTE — Telephone Encounter (Signed)
Health coaching 2   interpreter- Rudene Anda, [place]- 147 cholesterol , 84 LDL cholesterol , 63 triglycerides , 50 HDL cholesterol , 5.3 hemoglobin A1C , 95 mean plasma glucose  Patient understands and is aware of her lab results.   Goals-  Spoke with patient about lab results. Answered any questions that patient had about results.   Goals- Patient will work on adding vegetables into daily diet. With a goal of 3 cups per day. Patient will add whole grains in diet and heart healthy fish. Encouraged patient to also try and walk for 20-30 minutes a day.    Navigation:  Patient is aware of 1 more health coaching sessions and a follow up.   Time- 10 minutes

## 2019-07-31 ENCOUNTER — Telehealth: Payer: Self-pay

## 2019-07-31 NOTE — Telephone Encounter (Signed)
Health Coaching 3  interpreter-  Rudene Anda, [facility]   Goals- Patient has increased fruit servings to 3 cups a day. She has also been trying to eat more vegetables but is still not eating them daily. Patient has started walking for 15 minutes 5 days a week.   New goal- Increase vegetable intake to at least 1 cup of vegetables daily. Increase walking from 15 minutes 5 days a week to 20-25 minutes 5 days a week.  Barrier to reaching goal- None   Strategies to overcome- None    Navigation:  Patient is aware of  a follow up session. Patient is scheduled for follow-up appointment on Wednesday, September 13, 2019 @ 8:00 am   Time- 10 minutes

## 2019-09-13 ENCOUNTER — Inpatient Hospital Stay: Payer: Self-pay | Attending: Obstetrics and Gynecology | Admitting: *Deleted

## 2019-09-13 ENCOUNTER — Other Ambulatory Visit: Payer: Self-pay

## 2019-09-13 VITALS — BP 112/69 | Temp 97.3°F | Ht 61.0 in | Wt 145.0 lb

## 2019-09-13 DIAGNOSIS — Z0000 Encounter for general adult medical examination without abnormal findings: Secondary | ICD-10-CM

## 2019-09-13 NOTE — Progress Notes (Signed)
Wisewoman follow up   Interpreter: Natale Lay, [employer]   Clinical Measurement:  Height: 61 in Weight: 145 lb  Blood Pressure: 108/70  Blood Pressure #2: 112/69    Medical History: Patient states that she does not have a history high cholesterol, blood pressure or diabetes.   Medications:  Patient states that she does not take medication to lower cholesterol, blood pressure or blood sugar. Patient does not take an aspirin a day to help prevent a heart attack or stroke.   Blood pressure, self measurement:  Patient states that she does not measure blood pressure from home and has not been told to do so by a healthcare provider.    Nutrition:  Patient states that on average she eats 2 cups of fruit and 2 cups of vegetables per day. Patient states that she does eat fish at least 2 times per week. In a typical day patient eats less than half servings of whole grains. Patient drinks less than 36 ounces of beverages with added sugar weekly. Patient is currently watching sodium or salt intake. In the past 7 days patient has had drinks containing alcohol on 1 day. On average when the patient consumes alcohol she has 1-2 alcoholic beverages.   Physical activity:  Patient states that she gets 70 minutes of moderate and 0 minutes of vigorous physical activity each week.  Smoking status:  Patient states that she has never smoked tobacco.   Quality of life:  Over the past 2 weeks patient states that she has had 0 days where she has little interest or pleasure in doing things and 0 days where she has felt down, depressed or hopeless.    Health Coaching: Encouraged patient to try and add an extra serving of vegetables into daily diet to get the daily recommendation. Also encouraged patient to try and add more whole grains into diet. Gave suggestion to try whole wheat bread, whole wheat pasta, oatmeal, brown rice or whole grain cereals. Also encouraged patient to try and get 20 minutes of exercise daily.    Navigation: This was the  follow up session for this patient, I will check up on her progress in the coming months.  Time: 20 minutes

## 2020-06-12 ENCOUNTER — Other Ambulatory Visit: Payer: Self-pay | Admitting: Obstetrics and Gynecology

## 2020-06-12 DIAGNOSIS — Z1231 Encounter for screening mammogram for malignant neoplasm of breast: Secondary | ICD-10-CM

## 2020-07-30 ENCOUNTER — Ambulatory Visit: Payer: No Typology Code available for payment source

## 2020-08-29 ENCOUNTER — Ambulatory Visit: Payer: No Typology Code available for payment source

## 2021-02-21 ENCOUNTER — Other Ambulatory Visit: Payer: Self-pay | Admitting: Obstetrics and Gynecology

## 2021-02-21 DIAGNOSIS — Z1231 Encounter for screening mammogram for malignant neoplasm of breast: Secondary | ICD-10-CM

## 2021-04-08 ENCOUNTER — Ambulatory Visit: Payer: No Typology Code available for payment source

## 2021-05-08 ENCOUNTER — Other Ambulatory Visit: Payer: Self-pay | Admitting: Obstetrics and Gynecology

## 2021-05-08 DIAGNOSIS — Z1231 Encounter for screening mammogram for malignant neoplasm of breast: Secondary | ICD-10-CM

## 2021-05-27 ENCOUNTER — Ambulatory Visit: Payer: Self-pay | Admitting: *Deleted

## 2021-05-27 ENCOUNTER — Other Ambulatory Visit: Payer: Self-pay

## 2021-05-27 ENCOUNTER — Ambulatory Visit
Admission: RE | Admit: 2021-05-27 | Discharge: 2021-05-27 | Disposition: A | Discharge status: Home / Self Care | Payer: No Typology Code available for payment source | Source: Ambulatory Visit | Attending: Obstetrics and Gynecology | Admitting: Obstetrics and Gynecology

## 2021-05-27 VITALS — BP 104/72 | Wt 152.1 lb

## 2021-05-27 DIAGNOSIS — Z1231 Encounter for screening mammogram for malignant neoplasm of breast: Secondary | ICD-10-CM

## 2021-05-27 DIAGNOSIS — Z1211 Encounter for screening for malignant neoplasm of colon: Secondary | ICD-10-CM

## 2021-05-27 DIAGNOSIS — Z1239 Encounter for other screening for malignant neoplasm of breast: Secondary | ICD-10-CM

## 2021-05-27 NOTE — Progress Notes (Signed)
Brianna Maldonado is a 45 y.o. female who presents to [facility] clinic today with no complaints.    Pap Smear: Pap not smear completed today. Last Pap smear was 04/13/2019 at the free cervical cancer screening clinic and was normal with negative HPV. Patients previous Pap smear was 09/16/2017 at [facility] normal with negative HPV and 08/13/2016 at [facility] clinic normal with positive HPV. Patient has a history of one abnormal Pap smear 07/12/2015 that was LGSIL with positive HPV. 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 Per patient the Pap smear 07/12/2015 is the only abnormal Pap smear she has had. Last four Pap smears and colposcopy results are in EPIC.   Physical exam: Breasts Breasts symmetrical. No skin abnormalities bilateral breasts. No nipple retraction bilateral breasts. No nipple discharge bilateral breasts. No lymphadenopathy. No lumps palpated bilateral breasts. No complaints of pain or tenderness on exam.  MS DIGITAL SCREENING BILATERAL  Result Date: 09/16/2017 CLINICAL DATA:  Screening. EXAM: DIGITAL SCREENING BILATERAL MAMMOGRAM WITH CAD COMPARISON:  Previous exam(s). ACR Breast Density Category d: The breast tissue is extremely dense, which lowers the sensitivity of mammography. FINDINGS: There are no findings suspicious for malignancy. Images were processed with CAD. IMPRESSION: No mammographic evidence of malignancy. A result letter of this screening mammogram will be mailed directly to the patient. RECOMMENDATION: Screening mammogram in one year. (Code:SM-B-01Y) BI-RADS CATEGORY  1: Negative. Electronically Signed   By: Amie Portland M.D.   On: 09/16/2017 14:22   MM DIAG BREAST TOMO BILATERAL  Result Date: 09/01/2016 CLINICAL DATA:  The patient had a painful lump near the left nipple which has improved. EXAM: 2D DIGITAL DIAGNOSTIC BILATERAL MAMMOGRAM WITH CAD AND ADJUNCT TOMO ULTRASOUND LEFT BREAST COMPARISON:  Previous exam(s).  ACR Breast Density Category c: The breast tissue is heterogeneously dense, which may obscure small masses. FINDINGS: Multiple obscured masses are seen, left greater than right. Benign calcifications. No suspicious findings. Mammographic images were processed with CAD. On physical exam, no suspicious lumps identified today. Targeted ultrasound is performed, showing multiple cysts accounting for the patient's symptoms and mammographic findings. IMPRESSION: No mammographic or sonographic evidence of malignancy. RECOMMENDATION: Treatment of the patient's symptoms should be based on clinical and physical exam given lack of imaging findings. Recommend annual screening mammography. I have discussed the findings and recommendations with the patient. Results were also provided in writing at the conclusion of the visit. If applicable, a reminder letter will be sent to the patient regarding the next appointment. BI-RADS CATEGORY  2: Benign. Electronically Signed   By: Gerome Sam III M.D   On: 09/01/2016 08:49   MS DIGITAL SCREENING TOMO BILATERAL  Result Date: 04/13/2019 CLINICAL DATA:  Screening. EXAM: DIGITAL SCREENING BILATERAL MAMMOGRAM WITH TOMO AND CAD COMPARISON:  Previous exam(s). ACR Breast Density Category d: The breast tissue is extremely dense, which lowers the sensitivity of mammography FINDINGS: There are no findings suspicious for malignancy. Images were processed with CAD. IMPRESSION: No mammographic evidence of malignancy. A result letter of this screening mammogram will be mailed directly to the patient. RECOMMENDATION: Screening mammogram in one year. (Code:SM-B-01Y) BI-RADS CATEGORY  1: Negative. Electronically Signed   By: Emmaline Kluver M.D.   On: 04/13/2019 11:32        Pelvic/Bimanual Pap is not indicated today per BCCCP guidelines. Next Pap smear due in August 2023 per ASCCP guidelines.   Smoking History: Patient has never smoked.   Patient Navigation: Patient education  provided.  Access to services provided for patient through [place] program. Spanish interpreter  Brianna Maldonado from [facility] provided.   Colorectal Cancer Screening: Per patient has never had colonoscopy completed. FIT Test given to patient to complete. No complaints today.    Breast and Cervical Cancer Risk Assessment: Patient has no family history of breast cancer, known genetic mutations, or radiation treatment to the chest before age 100. Patient has a history of cervical dysplasia. Patient has not history of being immunocompromised or DES exposure in-utero.  Risk Assessment     Risk Scores       05/27/2021 04/13/2019   Last edited by: Narda Rutherford, LPN Stoney Bang H, LPN   5-year risk: 1.1 % 1.1 %   Lifetime risk: 9.6 % 9.9 %            A: BCCCP exam without pap smear No complaints.  P: Referred patient to the [facility] for a screening mammogram on mobile unit. Appointment scheduled Tuesday, May 27, 2021 at 1500.  Priscille Heidelberg, RN 05/27/2021 2:32 PM

## 2021-05-27 NOTE — Patient Instructions (Signed)
Explained breast self awareness with Landry Corporal. Patient did not need a Pap smear today due to last Pap smear and HPV typing was 04/13/2019. Let her know that her next Pap smear is due in three years due to her history and ASCCP guidelines. Referred patient to the [facility] for a screening mammogram on mobile unit. Appointment scheduled Tuesday, May 27, 2021 at 1500. Patient escorted to the mobile unit following BCCCP appointment for her screening mammogram. Let patient know the Breast Center will follow up with her within the next couple weeks with results of her mammogram by letter or phone. Janequa Kipnis Montanari verbalized understanding.  Layaan Mott, Kathaleen Maser, RN 2:32 PM

## 2022-08-10 ENCOUNTER — Other Ambulatory Visit: Payer: Self-pay | Admitting: Obstetrics and Gynecology

## 2022-08-10 DIAGNOSIS — Z1231 Encounter for screening mammogram for malignant neoplasm of breast: Secondary | ICD-10-CM

## 2022-09-24 ENCOUNTER — Ambulatory Visit
Admission: RE | Admit: 2022-09-24 | Discharge: 2022-09-24 | Disposition: A | Discharge status: Home / Self Care | Payer: No Typology Code available for payment source | Source: Ambulatory Visit | Attending: Obstetrics and Gynecology | Admitting: Obstetrics and Gynecology

## 2022-09-24 ENCOUNTER — Ambulatory Visit: Payer: Self-pay | Admitting: Hematology and Oncology

## 2022-09-24 VITALS — BP 104/79 | Wt 155.0 lb

## 2022-09-24 DIAGNOSIS — Z1231 Encounter for screening mammogram for malignant neoplasm of breast: Secondary | ICD-10-CM

## 2022-09-24 DIAGNOSIS — Z1211 Encounter for screening for malignant neoplasm of colon: Secondary | ICD-10-CM

## 2022-09-24 DIAGNOSIS — Z1239 Encounter for other screening for malignant neoplasm of breast: Secondary | ICD-10-CM

## 2022-09-24 NOTE — Patient Instructions (Signed)
[place] about BSE and gave educational materials to take home. Patient did not need a Pap smear today due to last Pap smear was in 2020 per patient. Told patient about free cervical cancer screenings to receive a Pap smear if would like one next year. Let her know BCCCP will cover Pap smears every 5 years unless has a history of abnormal Pap smears. Referred patient to the [place] for diagnostic mammogram. Appointment scheduled for 09/24/22. Patient aware of appointment and will be there. Let patient know will follow up with her within the next couple weeks with results. Aniyia Rane Chim verbalized understanding.  Melodye Ped, NP 10:32 AM

## 2022-09-24 NOTE — Progress Notes (Signed)
[name] is a 47 y.o. female who presents to [facility] clinic today with no complaints.    Pap Smear: Pap not smear completed today. Last Pap smear was 2020 and was normal. Per patient has history of an abnormal Pap smear. Last Pap smear result is available in Epic. 2020 - normal/ HPV-; 2019 - normal/ HPV-; 2017 normal/ HPV+; 2016 LSIL/ HPV+   Physical exam: Breasts Breasts symmetrical. No skin abnormalities bilateral breasts. No nipple retraction bilateral breasts. No nipple discharge bilateral breasts. No lymphadenopathy. No lumps palpated bilateral breasts. MM 3D SCREEN BREAST BILATERAL  Result Date: 06/12/2021 CLINICAL DATA:  Screening. EXAM: DIGITAL SCREENING BILATERAL MAMMOGRAM WITH TOMOSYNTHESIS AND CAD TECHNIQUE: Bilateral screening digital craniocaudal and mediolateral oblique mammograms were obtained. Bilateral screening digital breast tomosynthesis was performed. The images were evaluated with computer-aided detection. COMPARISON:  Previous exam(s). ACR Breast Density Category d: The breast tissue is extremely dense, which lowers the sensitivity of mammography FINDINGS: There are no findings suspicious for malignancy. IMPRESSION: No mammographic evidence of malignancy. A result letter of this screening mammogram will be mailed directly to the patient. RECOMMENDATION: Screening mammogram in one year. (Code:SM-[id number]) BI-RADS CATEGORY  1: Negative. Electronically Signed   By: Abelardo Diesel M.D.   On: 06/12/2021 13:18   MS DIGITAL SCREENING TOMO BILATERAL  Result Date: 04/13/2019 CLINICAL DATA:  Screening. EXAM: DIGITAL SCREENING BILATERAL MAMMOGRAM WITH TOMO AND CAD COMPARISON:  Previous exam(s). ACR Breast Density Category d: The breast tissue is extremely dense, which lowers the sensitivity of mammography FINDINGS: There are no findings suspicious for malignancy. Images were processed with CAD. IMPRESSION: No mammographic evidence of malignancy. A result letter of this screening  mammogram will be mailed directly to the patient. RECOMMENDATION: Screening mammogram in one year. (Code:SM-B-01Y) BI-RADS CATEGORY  1: Negative. Electronically Signed   By: Audie Pinto M.D.   On: 04/13/2019 11:32  '       Pelvic/Bimanual Pap is not indicated today    Smoking History: Patient has never smoked and was not referred to quit line.    Patient Navigation: Patient education provided. Access to services provided for patient through [place] interpreter provided. No transportation provided   Colorectal Cancer Screening: Per patient has never had colonoscopy completed No complaints today.    Breast and Cervical Cancer Risk Assessment: Patient does not have family history of breast cancer, known genetic mutations, or radiation treatment to the chest before age 55. Patient has history of cervical dysplasia, immunocompromised, or DES exposure in-utero.  Risk Assessment   No risk assessment data for the current encounter  Risk Scores       05/27/2021   Last edited by: Demetrius Revel, LPN   5-year risk: 1.1 %   Lifetime risk: 9.6 %            A: BCCCP exam without pap smear No complaints with benign exam. She had an episode of vaginal bleeding 2 months ago after having no period for approximately 12 months. She is not sure if it is less or more than 12 months. The bleeding lasted 4 days and she describes it as heavy. We will refer to gynecology for ultrasound and possible endometrial biopsy.   P: Referred patient to the [place] for a screening mammogram. Appointment scheduled 09/24/22.  Melodye Ped, NP 09/24/2022 10:32 AM

## 2023-02-04 IMAGING — MG MM DIGITAL SCREENING BILAT W/ TOMO AND CAD
8 series · 9 of 24 positions shown · non-contrast
Comparison: Previous exam(s).

CLINICAL DATA: Screening.

EXAM:
DIGITAL SCREENING BILATERAL MAMMOGRAM WITH TOMOSYNTHESIS AND CAD
TECHNIQUE: Bilateral screening digital craniocaudal and mediolateral oblique
mammograms were obtained. Bilateral screening digital breast
tomosynthesis was performed. The images were evaluated with
computer-aided detection.

[L CC synth-2D]
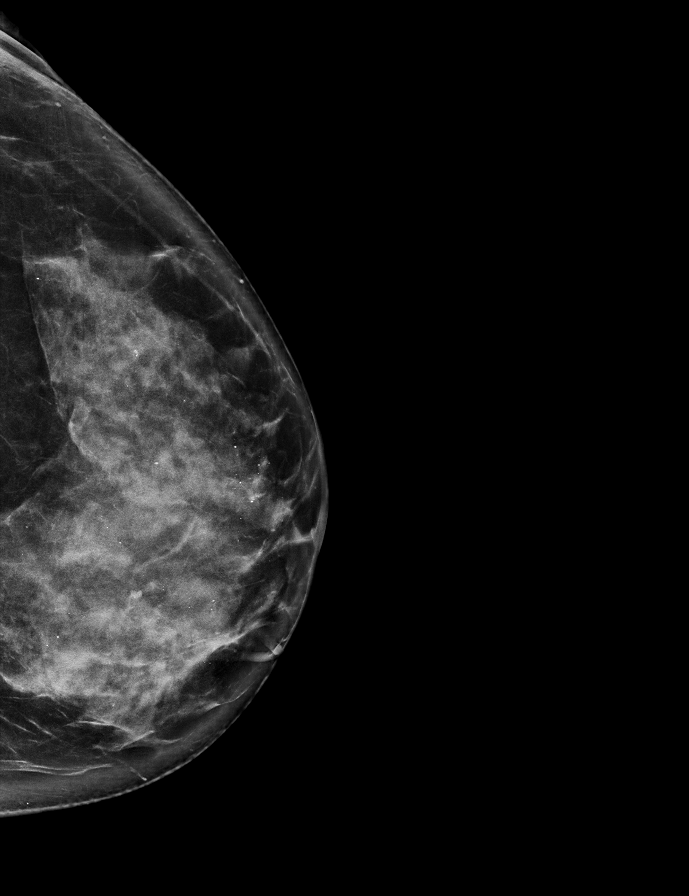

[L MLO synth-2D]
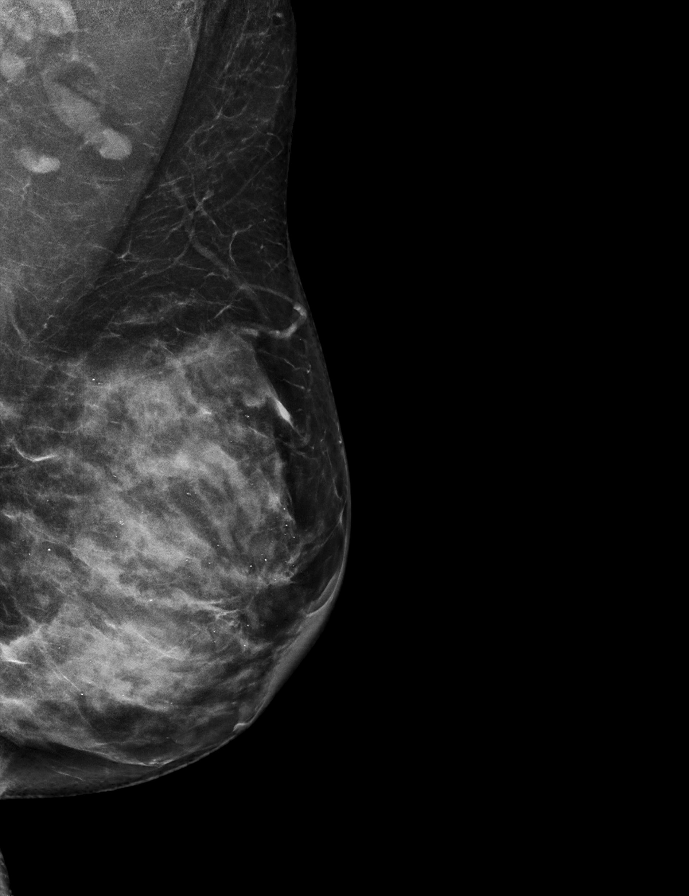

[R MLO synth-2D]
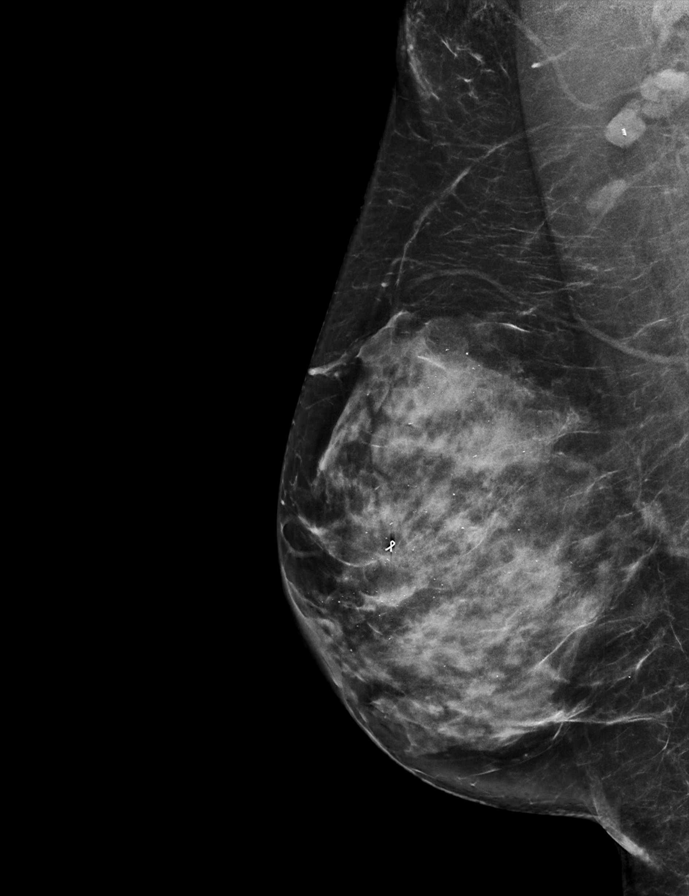

[R CC synth-2D]
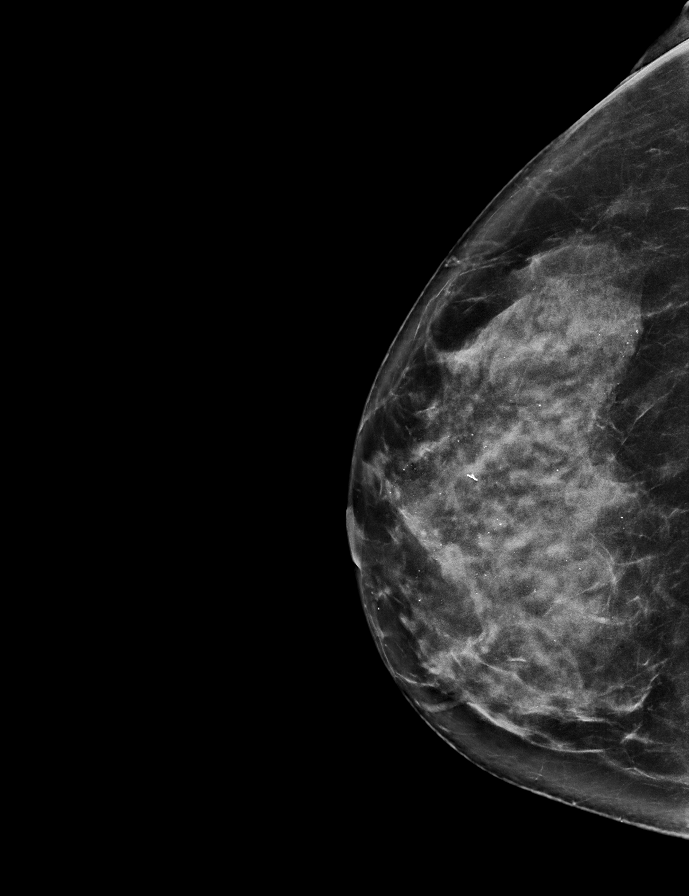

[L CC tomo · 2 of 73 frames shown]
[frame 24/73]
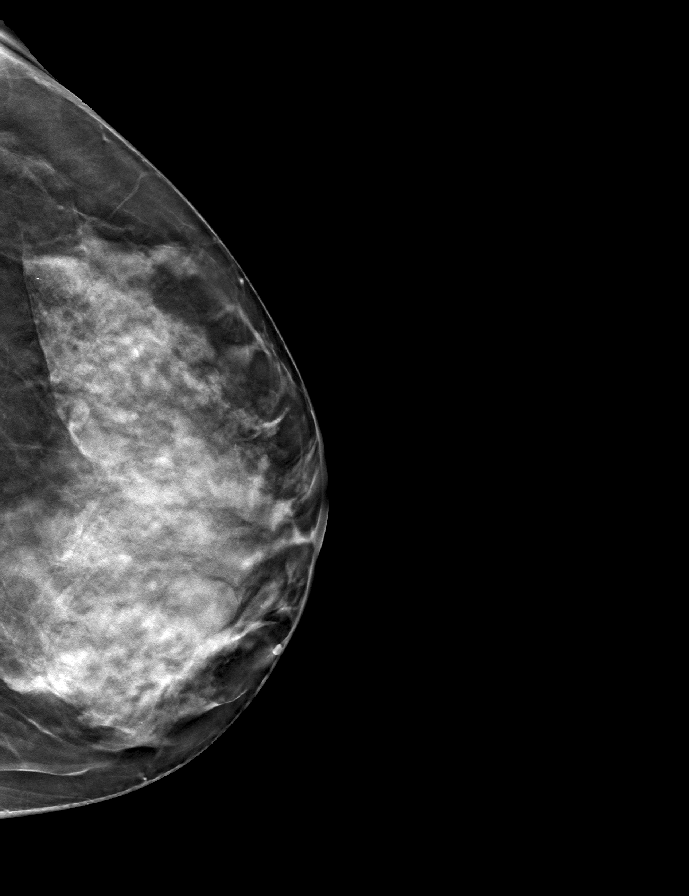
[frame 37/73]
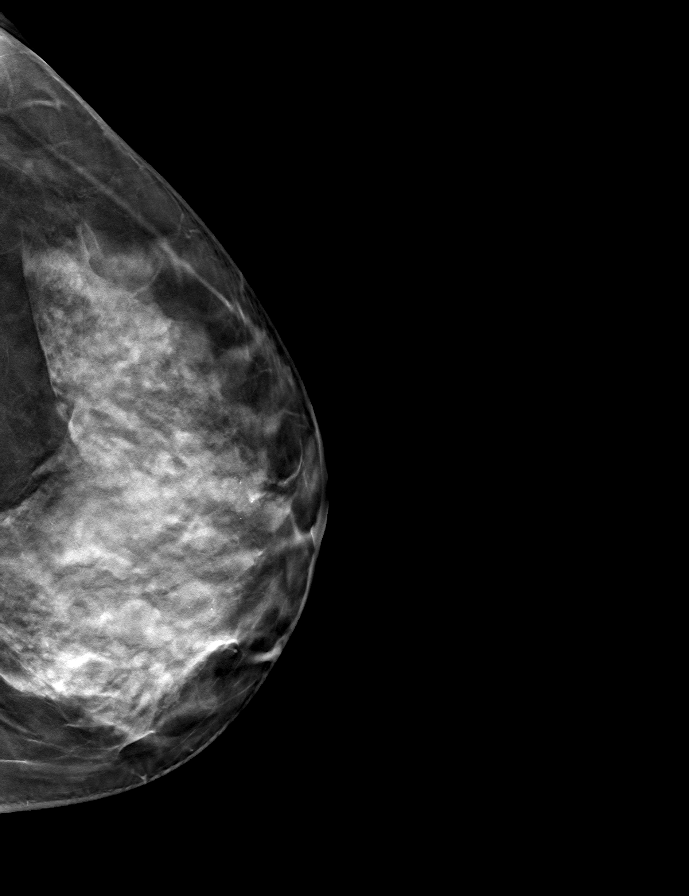

[R MLO tomo · tomo slice 37/74.0]
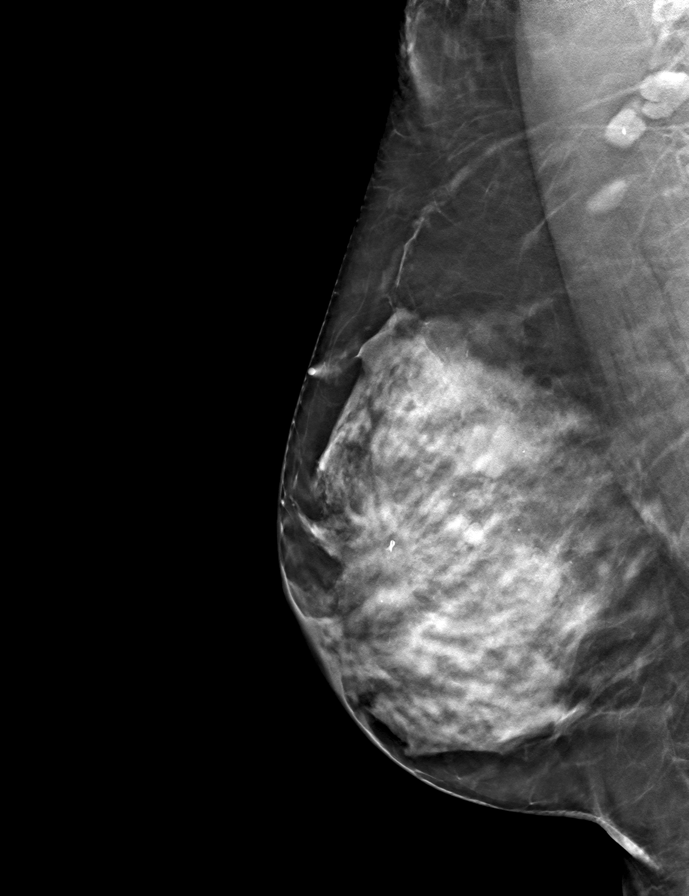

[R CC tomo · tomo slice 37/72.0]
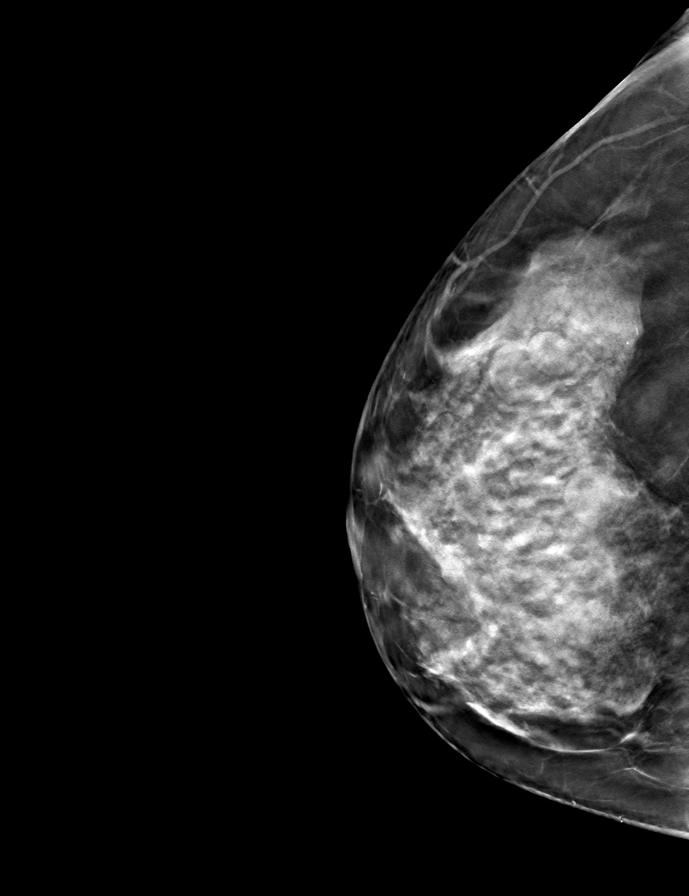

[L MLO tomo · tomo slice 37/74.0]
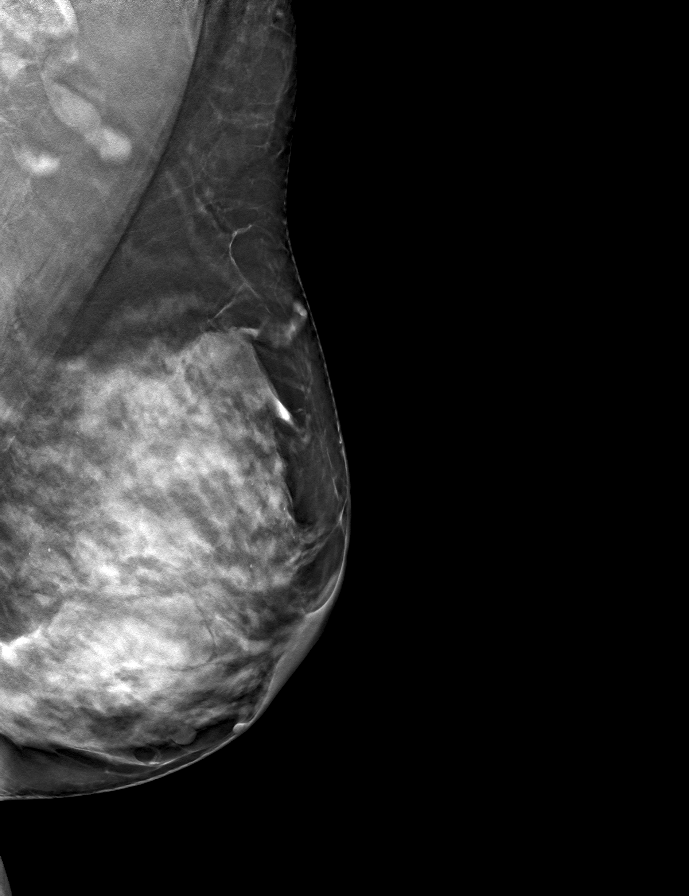

[9 of 24 positions shown; findings below may reference images not displayed]

ACR Breast Density Category d: The breast tissue is extremely dense,
which lowers the sensitivity of mammography
FINDINGS: There are no findings suspicious for malignancy.
IMPRESSION: No mammographic evidence of malignancy. A result letter of this
screening mammogram will be mailed directly to the patient.

RECOMMENDATION:
Screening mammogram in one year. (Code:[id number])

BI-RADS CATEGORY  1: Negative.

## 2023-11-04 ENCOUNTER — Other Ambulatory Visit: Payer: Self-pay | Admitting: Obstetrics and Gynecology

## 2023-11-04 DIAGNOSIS — Z1231 Encounter for screening mammogram for malignant neoplasm of breast: Secondary | ICD-10-CM

## 2023-12-23 ENCOUNTER — Ambulatory Visit: Payer: Self-pay | Admitting: Hematology and Oncology

## 2023-12-23 ENCOUNTER — Ambulatory Visit: Payer: No Typology Code available for payment source

## 2023-12-23 ENCOUNTER — Ambulatory Visit
Admission: RE | Admit: 2023-12-23 | Discharge: 2023-12-23 | Disposition: A | Discharge status: Home / Self Care | Payer: No Typology Code available for payment source | Source: Ambulatory Visit | Attending: Obstetrics and Gynecology | Admitting: Obstetrics and Gynecology

## 2023-12-23 VITALS — BP 103/47 | Wt 153.0 lb

## 2023-12-23 DIAGNOSIS — Z1231 Encounter for screening mammogram for malignant neoplasm of breast: Secondary | ICD-10-CM

## 2023-12-23 NOTE — Patient Instructions (Addendum)
 Taught Brianna Maldonado about self breast awareness and gave educational materials to take home. Patient did not need a Pap smear today due to last Pap smear was in 04/13/2019 per patient. Told patient about free cervical cancer screenings to receive a Pap smear if would like one next year. Let her know BCCCP will cover Pap smears every 5 years unless has a history of abnormal Pap smears. Referred patient to the [facility] for diagnostic mammogram. Appointment scheduled for 12/23/2023. Patient aware of appointment and will be there. Let patient know will follow up with her within the next couple weeks with results. Brianna Maldonado verbalized understanding.  Adelaide Adjutant, NP 3:06 PM

## 2023-12-23 NOTE — Progress Notes (Signed)
 Ms. SHANNA UN is a 48 y.o. female who presents to [facility] clinic today with no complaints.    Pap Smear: Pap not smear completed today. Last Pap smear was 04/13/2019 and was normal. Per patient has no history of an abnormal Pap smear. Last Pap smear result is available in Epic.   Physical exam: Breasts Breasts symmetrical. No skin abnormalities bilateral breasts. No nipple retraction bilateral breasts. No nipple discharge bilateral breasts. No lymphadenopathy. No lumps palpated bilateral breasts.       Pelvic/Bimanual Pap is not indicated today    Smoking History: Patient has never smoked and was not referred to quit line.    Patient Navigation: Patient education provided. Access to services provided for patient through [facility] program. Herma Longest interpreter provided. No transportation provided   Colorectal Cancer Screening: Per patient has never had colonoscopy completed No complaints today.    Breast and Cervical Cancer Risk Assessment: Patient does not have family history of breast cancer, known genetic mutations, or radiation treatment to the chest before age 71. Patient does not have history of cervical dysplasia, immunocompromised, or DES exposure in-utero.   Risk Scores as of Encounter on 12/23/2023     Gregary Lean           5-year 0.94%   Lifetime 9.85%            Last calculated by Silas, Ansyi K, CMA on 12/23/2023 at  3:18 PM         A: [facility] exam without pap smear No complaints with benign exam.   P: Referred patient to the [facility] for a screening mammogram. Appointment scheduled 12/23/2023.  Adelaide Adjutant, NP 12/23/2023 3:07 PM
